# Patient Record
Sex: Female | Born: 1960 | Race: White | Hispanic: No | Marital: Married
Health system: Northeastern US, Academic
[De-identification: ages and names within clinical notes are randomized; demographics above are authoritative.]

---

## 2017-07-18 ENCOUNTER — Ambulatory Visit

## 2017-07-24 ENCOUNTER — Ambulatory Visit

## 2017-09-05 ENCOUNTER — Ambulatory Visit

## 2017-09-06 ENCOUNTER — Ambulatory Visit: Admitting: "Endocrinology

## 2017-09-06 NOTE — Progress Notes (Signed)
* * *        **Ann Blankenship**    --- ---    83 Y old Female, DOB: Jun 01, 1960, External MRN: [id number]    Account Number: [id number]    [address], [place], [id number]    Home: [phone]    Insurance: [employer] PPO Payer ID: PAPER    PCP: Pamelia Hoit, DO Referring: Pamelia Hoit, DO External Visit  ID: [id number]    Appointment Facility: [facility]        * * *    09/06/2017  Progress Notes: Vanetta Shawl **CHN#:** [id number]    --- ---    ---         **Current Medications**    ---    Taking     * Anastrozole 1 MG Tablet 1 tablet Orally Once a day    ---    * Calcium Carbonate-Vitamin D 600-200 MG-UNIT Capsule 1 capsule Orally Twice a day    ---    * Colace 100 MG Capsule 1 capsule as needed Orally Once a day    ---    * Prolia 60 MG/ML Solution as directed Subcutaneous 1 injection every 6 mons    ---    Not-Taking/PRN    * OXYCODONE 5 MG TABLET 1 tab every 4 hours PRN    ---    * PredniSONE 20 MG Tablet 1 tablet Orally Once a day    ---    * Probiotic 250 MG Capsule 1 capsule Orally Twice a day    ---    * Tramadol HCl 50 mg Tablet 1/2 tablet every 4 hours PRN    ---    * Zofran 4 MG Tablet 1 tablet Orally Once a day, Notes: PRN    ---    * Medication List reviewed and reconciled with the patient    ---      Past Medical History    ---       Systemic lupus erythematosus.        ---    Malignant neoplasm of breast, estrogen receptor positive.        ---    Basal cell carcinoma of skin.        ---    Osteopenia.        ---    Congenital hearing disorder/loss.        ---    Subacute bacterial endocarditis.        ---       **Surgical History**    ---       masectomy, breast biopsies prior 02/2017    ---    c-section 1998    ---    MOHS surg on face 2010    ---       **Family History**    ---       Mother: alive    ---    Father: alive    ---    age related med conditions with parents\n++ autoimmune disorders within fam hx    mother- thyroid issue, low BP.    ---        **Social History**    ---    Tobacco history: Formerly smoked.    Marital Status  Married.    Alcohol  Yes regularly socially weekly.   part time gardener.    ---       **Allergies**    ---       Chlorhexidine: rash    ---    [  Allergies Verified]       **Hospitalization/Major Diagnostic Procedure**    ---       Denies Past Hospitalization    ---       **Review of Systems**    ---     _Endo: Bone_ :    Constitutional: no fever, no abnormal weight change, no chills. Eyes: no  diplopia, clear vision. Head: no sore throat, no ear ache, no congestion, no  hair loss. Cardiovascular: no chest pain, no palpitations, no lightheadedness.  Pulmonary: no cough, no shortness of breath. GI: no abdominal pain, no  diarrhea, no constipation, no vomiting, no nausea, no epigastric pain, no  change in bowel habits. GU: no polyuria, no nocturia, no hematuria, no  dysuria, no hx of kidney stones. Skin: no rashes, no lesions, no edema, no  abnormal striae, no easy bruising, wound healing normal. Neurology: no  headaches, no syncope, no tremors. Psychology: no mood changes.  Musculoskeletal: no weakness, no stumbling gait, no joint pain, no back pain.            **Reason for Appointment**    ---       1\. OSTEOPENIA    ---       **History of Present Illness**    ---     _GENERAL_ :    Initial visit: osteopenia    Diagnosed: was diagnosed a couple years ago, unsure exactly when    Medications: has received 2 injections of Prolia (04/2017, 03/2016) via  oncology    had been on Foasmax for 3 years (2014-2017) and it was stopped by  endocrinologist (Dr. Joaquim Lai) because she felt that she was not getting any  benefit    last saw outside endo 03/2016    Prior fractures: no fracture    Parent history of hip fracture or family history of calcium/bone disorders:    Smoking/etoh: none currently/social    Glucocorticoid use: had been on daily steroids remotely in her 30s for SLE,  nothing in >18 years    Rheumatoid arthiritis: no    Breast cancer:  diagnosed 12/2016 and underwent mastectomy and on Anastrazole  since 03/2017 with plan to be on this for 5 years    Thyroid disease: no    GERD/Proton pump inhibitor use: no    Lactose intolerance/GI issues:    Dietary calcium/vit D intake: loves dairy, has cheese/yogurt/milk daily    Supplementation: takes 1 ca/D 2xd    Age at menopause: age 24    Prior hormone replacement therapy: no    Weight: stable    Height loss: think she has shrunk a bit    Exercise: goes to the gym twice a week and works as a Agricultural engineer    Per endo: prior compression at T6 and T8 based on thoracic xray in 2015.       **Vital Signs**    ---    Pain scale 0, Ht-in 59, Wt-lbs 102.4, BMI 20.68, BP 100/62, HR 68, Ht-cm  149.86, Wt-kg 46.45.       **Physical Examination**    ---    Gen: AAO x 3 NAD    Thyroid: normal, nontender, no nodules appreciated    CV: RRR no murmurs    Ext: no tremor of outstretched hands    Neuro: 2+ patellar deep tendon reflexes bilaterally    Back: No kyphosis or scars seen.       **Assessments**    ---    1\. Osteopenia after menopause - M81.0 (Primary)    ---  2\. Wedge compression fracture of T7-T8 vertebra, initial encounter for closed  fracture - S22.060A    ---    3\. Aromatase inhibitor use - Z79.811    ---      57 year old female here for initial visit for osteopenia, diagnosed in  approximately 2015. No fracture history. History of breast cancer now on  anastrozole since December 2018. A compression fracture was seen at T7 and T8  on a plain film from 2015. She has no other current risk factors for low bone  density. BMI 21.    She was previously on alendronate from 2014-2017 per outside endocrinologist.    Prolia shot number number one done in December 2017 and shot #2 done in  February 2019 at her oncologist's office.    ---    Outside hospital 2015 DEXA scan T-scores    Lumbar spine -0.5    Left fem neck -2.3    Total hip -1.7    Right fem neck -2.3    Right total hip -1.5    ---    Outside hospital 2017 DEXA  scan T-scores    Lumbar spine 1.2    Left femoral neck -1.3    Left hip -0.7    ---    Assessment/plan: We will update her DEXA scan as she has not had an updated  scan done in 2 years. We discussed that the decision to continue treatment  with Prolia will be based on her DEXA result. She will be on anastrozole  through 2023 so this will be an ongoing risk factor for low bone density. She  believes she has had recent blood work done through her oncologist office and  we will obtain these and add on labs if needed to evaluate for secondary  causes of bone loss. She is already on adequate calcium/D supplementation and  eats a fair amount of dairy. Follow-up will be in approximately 1 month once  updated DEXA and laboratory studies are completed.    ---    30-minute visit, greater than 15 minutes spent counseling patient on  osteopenia.    ---       **Treatment**    ---       **1\. Osteopenia after menopause**    _IMAGING: DEXA Hip and Spine_     57 yo with osteopenia, no fracture history,  on Anastrozole for breast cancer, needs 2 year f/u DEXA. 2017 outside DEXA  scan showed L spine T score 1.2, Left fem neck -1.3, Left hip -0.7    --- ---       **Follow Up**    ---    scheduled DEXA [place] after 6/8, get recent labs from PCP/hematology (done in  march), f/u in early june after DEXA here or later july after labs and dexa  done to disuss plan    Electronically signed by Vanetta Shawl MD on 09/06/2017 at 02:12 PM EDT    Sign off status: Completed        * * *        [facility]    [address]    [place], [place] [address]    Tel: [phone]    Fax: [phone]              * * *          Patient: Ann Blankenship, Ann Blankenship DOB: 1960/12/27 Progress Note: Vanetta Shawl  09/06/2017    ---    Note generated by eClinicalWorks  EMR/PM Software ([email or web address])

## 2017-09-06 NOTE — Progress Notes (Signed)
.  Progress Notes  .  Patient: Ann Blankenship  Provider: Vanetta Shawl  MD  .  DOB: July 09, 1960 Age: 57 Y Sex: Female  .  PCP: Pamelia Hoit DO  Date: 09/06/2017  .  --------------------------------------------------------------------------------  .  REASON FOR APPOINTMENT  .  1. OSTEOPENIA  .  HISTORY OF PRESENT ILLNESS  .  GENERAL:  Initial visit: osteopeniaDiagnosed: was  diagnosed a couple years ago, unsure exactly whenMedications: has  received 2 injections of Prolia (04/2017, 03/2016) via oncologyhad  been on Foasmax for 3 years (2014-2017) and it was stopped by  endocrinologist (Dr. Joaquim Lai) because she felt that she was not  getting any benefitlast saw outside endo 12/2017Prior fractures:  no fractureParent history of hip fracture or family history of  calcium/bone disorders:Smoking/etoh: none currently/social  Glucocorticoid use: had been on daily steroids remotely in her  30s for SLE, nothing in >18 yearsRheumatoid arthiritis: noBreast  cancer: diagnosed 12/2016 and underwent mastectomy and on  Anastrazole since 03/2017 with plan to be on this for 5  yearsThyroid disease: noGERD/Proton pump inhibitor use: noLactose  intolerance/GI issues:Dietary calcium/vit D intake: loves dairy,  has cheese/yogurt/milk dailySupplementation: takes 1 ca/D 2xdAge  at menopause: age 50Prior hormone replacement therapy: noWeight:  stableHeight loss: think she has shrunk a bitExercise: goes to  the gym twice a week and works as a gardenerPer endo: prior  compression at T6 and T8 based on thoracic xray in 2015.  Marland Kitchen  CURRENT MEDICATIONS  .  Taking Anastrozole 1 MG Tablet 1 tablet Orally Once a day  Taking Calcium Carbonate-Vitamin D 600-200 MG-UNIT Capsule 1  capsule Orally Twice a day  Taking Colace 100 MG Capsule 1 capsule as needed Orally Once a  day  Taking Prolia 60 MG/ML Solution as directed Subcutaneous 1  injection every 6 mons  Not-Taking/PRN OXYCODONE 5 MG TABLET 1 tab every 4 hours PRN  Not-Taking/PRN PredniSONE  20 MG Tablet 1 tablet Orally Once a day  Not-Taking/PRN Probiotic 250 MG Capsule 1 capsule Orally Twice a  day  Not-Taking/PRN Tramadol HCl 50 mg Tablet 1/2 tablet every 4 hours  PRN  Not-Taking/PRN Zofran 4 MG Tablet 1 tablet Orally Once a day,  Notes: PRN  Medication List reviewed and reconciled with the patient  .  PAST MEDICAL HISTORY  .  Systemic lupus erythematosus  Malignant neoplasm of breast, estrogen receptor positive  Basal cell carcinoma of skin  Osteopenia  Congenital hearing disorder/loss  Subacute bacterial endocarditis  .  ALLERGIES  .  Chlorhexidine: rash  .  SURGICAL HISTORY  .  masectomy, breast biopsies prior 02/2017  c-section 1998  MOHS surg on face 2010  .  FAMILY HISTORY  .  Mother: alive  Father: alive  age related med conditions with parents\n++ autoimmune disorders  within fam hxmother- thyroid issue, low BP.  Marland Kitchen  SOCIAL HISTORY  .  .  Tobaccohistory:Formerly smoked.  .  Marital Status Married.  .  Alcohol Yes regularly socially weekly.  .  part time gardener.  Marland Kitchen  HOSPITALIZATION/MAJOR DIAGNOSTIC PROCEDURE  .  Denies Past Hospitalization  .  REVIEW OF SYSTEMS  .  Endo: Bone:  .  Constitutional:    no fever, no abnormal weight change, no chills  . Eyes:    no diplopia, clear vision . Head:    no sore throat,  no ear ache, no congestion, no hair loss . Cardiovascular:    no  chest pain, no palpitations, no lightheadedness . Pulmonary:  no cough, no shortness of breath . GI:    no abdominal pain, no  diarrhea, no constipation, no vomiting, no nausea, no epigastric  pain, no change in bowel habits . GU:    no polyuria, no  nocturia, no hematuria, no dysuria, no hx of kidney stones .  Skin:    no rashes, no lesions, no edema, no abnormal striae, no  easy bruising, wound healing normal . Neurology:    no headaches,  no syncope, no tremors . Psychology:    no mood changes .  Musculoskeletal:    no weakness, no stumbling gait, no joint  pain, no back pain .  Marland Kitchen  VITAL SIGNS  .  Pain scale 0,  Ht-in 59, Wt-lbs 102.4, BMI 20.68, BP 100/62, HR  68, Ht-cm 149.86, Wt-kg 46.45.  Marland Kitchen  PHYSICAL EXAMINATION  .  Gen: AAO x 3 NADThyroid: normal, nontender, no nodules  appreciatedCV: RRR no murmursExt: no tremor of outstretched  handsNeuro: 2+ patellar deep tendon reflexes bilaterallyBack: No  kyphosis or scars seen.  .  ASSESSMENTS  .  Osteopenia after menopause - M81.0 (Primary)  .  Wedge compression fracture of T7-T8 vertebra, initial encounter  for closed fracture - S22.060A  .  Aromatase inhibitor use - Z29.14  .  57 year old female here for initial visit for osteopenia,  diagnosed in approximately 2015. No fracture history. History of  breast cancer now on anastrozole since December 2018. A  compression fracture was seen at T7 and T8 on a plain film from  2015. She has no other current risk factors for low bone density.  BMI 21.She was previously on alendronate from 2014-2017 per  outside endocrinologist.Prolia shot number number one done in  December 2017 and shot #2 done in February 2019 at her  oncologist's office.---Outside hospital 2015 DEXA scan  T-scoresLumbar spine -0.5Left fem neck -2.3Total hip -1.7Right  fem neck -2.3Right total hip -1.5---Outside hospital 2017 DEXA  scan T-scoresLumbar spine 1.2Left femoral neck -1.3Left hip  -0.7---Assessment/plan: We will update her DEXA scan as she has  not had an updated scan done in 2 years. We discussed that the  decision to continue treatment with Prolia will be based on her  DEXA result. She will be on anastrozole through 2023 so this will  be an ongoing risk factor for low bone density. She believes she  has had recent blood work done through her oncologist office and  we will obtain these and add on labs if needed to evaluate for  secondary causes of bone loss. She is already on adequate  calcium/D supplementation and eats a fair amount of dairy.  Follow-up will be in approximately 1 month once updated DEXA and  laboratory studies are  completed.---30-minute visit, greater than  15 minutes spent counseling patient on osteopenia.  Marland Kitchen  TREATMENT  .  Osteopenia after menopause  DEXA Hip and [id number] yo with osteopenia, no fracture  history, on Anastrozole for breast cancer, needs 2 year f/u DEXA.  2017 outside DEXA scan showed L spine T score 1.2, Left fem neck  -1.3, Left hip -0.7  .  FOLLOW UP  .  scheduled DEXA [place] after 6/8, get recent labs from  PCP/hematology (done in march), f/u in early june after DEXA here  or later july after labs and dexa done to disuss plan  .  Electronically signed by Vanetta Shawl MD on  09/06/2017 at 02:12 PM EDT  .  Document electronically signed by Vanetta Shawl  MD  .

## 2017-10-11 ENCOUNTER — Ambulatory Visit: Admitting: "Endocrinology

## 2017-10-11 ENCOUNTER — Ambulatory Visit (HOSPITAL_BASED_OUTPATIENT_CLINIC_OR_DEPARTMENT_OTHER): Admitting: Psychiatry

## 2017-10-11 ENCOUNTER — Ambulatory Visit

## 2017-10-11 NOTE — Progress Notes (Signed)
.  Progress Notes  .  Patient: Ann Blankenship  Provider: Vanetta Shawl  MD  .  DOB: 06/04/1960 Age: 57 Y Sex: Female  .  PCP: Pamelia Hoit DO  Date: 10/11/2017  .  --------------------------------------------------------------------------------  .  REASON FOR APPOINTMENT  .  1. FU 06/12 DEXA  .  HISTORY OF PRESENT ILLNESS  .  GENERAL:  Follow up visit: here to discuss dexa  results/plan.  .  CURRENT MEDICATIONS  .  Taking Anastrozole 1 MG Tablet 1 tablet Orally Once a day  Taking Calcium Carbonate-Vitamin D 600-200 MG-UNIT Capsule 1  capsule Orally Twice a day  Not-Taking/PRN Colace 100 MG Capsule 1 capsule as needed Orally  Once a day  Not-Taking/PRN OXYCODONE 5 MG TABLET 1 tab every 4 hours PRN  Not-Taking/PRN PredniSONE 20 MG Tablet 1 tablet Orally Once a day  Not-Taking/PRN Probiotic 250 MG Capsule 1 capsule Orally Twice a  day  Not-Taking/PRN Tramadol HCl 50 mg Tablet 1/2 tablet every 4 hours  PRN  Not-Taking/PRN Zofran 4 MG Tablet 1 tablet Orally Once a day,  Notes: PRN  Discontinued Prolia 60 MG/ML Solution as directed Subcutaneous 1  injection every 6 mons  Medication List reviewed and reconciled with the patient  .  PAST MEDICAL HISTORY  .  Systemic lupus erythematosus  Malignant neoplasm of breast, estrogen receptor positive  Basal cell carcinoma of skin  Osteopenia  Congenital hearing disorder/loss  Subacute bacterial endocarditis  .  ALLERGIES  .  Chlorhexidine: rash  .  SURGICAL HISTORY  .  masectomy, breast biopsies prior 02/2017  c-section 1998  MOHS surg on face 2010  .  FAMILY HISTORY  .  Mother: alive  Father: alive  age related med conditions with parents\\n++ autoimmune disorders  within fam hx\nmother- thyroid issue, low BP.  Marland Kitchen  SOCIAL HISTORY  .  .  Tobaccohistory:Formerly smoked.  .  Marital Status Married.  .  Alcohol Yes regularly socially weekly.  .  part time gardener.  Marland Kitchen  HOSPITALIZATION/MAJOR DIAGNOSTIC PROCEDURE  .  Denies Past Hospitalization  .  REVIEW OF  SYSTEMS  .  Constitutional: no fever, no abnormal weight change, no  chillsEyes: no diplopia, clear visionHead: no sore throat, no ear  ache, no congestion, no hair loss. Cardiovascular: no chest pain,  no palpitations, no lightheadednessPulmonary: no cough, no  shortness of breathGI: no abdominal pain, no diarrhea, no  constipation, no vomiting, no nausea, no epigastric pain, no  change in bowel habitsGU: no polyuria, polydypsia, no nocturia,  no hematuria, no dysuria, no hx of kidney stones. Skin: no  rashes, no lesions, no edema, no abnormal striae, no easy  bruising, wound healing normalNeurology: no headaches, no  syncope, no tremors, no numbness or tinglingPsychology: no mood  changesMusculoskeletal: no weakness, no stumbling gait, no joint  pain, no back pain.  Marland Kitchen  VITAL SIGNS  .  Pain scale 0, Ht-in 59, Wt-lbs 103, BMI 20.80, BP 102/60, HR 62,  Ht-cm 149.86, Wt-kg 46.72.  Marland Kitchen  PHYSICAL EXAMINATION  .  Gen: AAO x 3 NAD.  Marland Kitchen  ASSESSMENTS  .  Osteopenia after menopause - M81.0 (Primary)  .  Wedge compression fracture of T7-T8 vertebra, initial encounter  for closed fracture - S22.060A  .  Aromatase inhibitor use - Z63.80  .  57 year old female here for initial visit for osteopenia,  diagnosed in approximately 2015. No fracture history. History of  breast cancer now on anastrozole since December 2018. A  compression fracture  was seen at T7 and T8 on a plain film from  2015. She has no other current risk factors for low bone density.  BMI 21.She was previously on alendronate from 2014-2017 per  outside endocrinologist.Prolia shot number number one done in  December 2017 and shot #2 done in February 2019 at her  oncologist's office.---Outside hospital 2015 DEXA scan  T-scoresLumbar spine -0.5Left fem neck -2.3Total hip -1.7Right  fem neck -2.3Right total hip -1.5---Outside hospital 2017 DEXA  scan T-scoresLumbar spine 1.2Left femoral neck -1.3Left hip  -0.7---Outside hospital 2019 DEXA scan T-scoresLumbar spine  -0.2:  4.6% increase compared with 2015Left proximal femur -2.2Left  total hip -1.6Right proximal femur -2Right total hip -1.2Total  mean hips -1.4: 3.7% increase compared with 2015---04/2017 outside  labs:PTH 50 calcium 9.6 albumin 4.8Comprehensive metabolic panel  and complete blood count with differential  normal---Assessment/plan: 2019 DEXA report reviewed and shows  osteopenia. FRAX calculator indicates that 10-year probability of  major osteoporotic fracture is 15% and hip fracture risk is 2.5%.  (Taking into account history of compression fracture). We  discussed that the [facility] calculator does not take into account the  fact that she is on ongoing treatment with anastrozole. Given  this, we will repeat her DEXA at a one-year interval. She is  already on adequate calcium/D supplementation and eats a fair  amount of dairy. Encouraged her to be as active as possible with  weightbearing exercise. We will complete work-up for secondary  causes of osteoporosis with laboratory evaluation as below.---15  min visit, 10 min spent counseling patient on osteopenia.  Marland Kitchen  TREATMENT  .  Osteopenia after menopause  LAB: IRON, TIBC AND FERRITIN PANEL  .  LAB: PTH, INTACT AND CALCIUM  .  LAB: TSH W/REFLEX TO FT4  .  LAB: TISSUE TRANSGLUTAMINASE ANTIBODY, IGA (REFL)  .  LAB: Vitamin D, 25 Hydroxy, Total , Immunoassay  .  DEXA Hip, Spine, and Radius (Ordered for 10/11/2018)[id number] yo  F with history of T7-T8 compression fracture with osteopenia, on  Anastrazole for breast cancer, needs 1 year f/u  .  Marland Kitchen  Others  Notes:  .  Please allow at least 24 hours for any calls to be returned. For  urgent calls on nights and weekends, please call at [phone]  and ask the on call endocrinology doctor to be paged.  .  If you arrive late to your appointment, we cannot guarantee you  will be seen and you may be asked to reschedule your appointment  for a later date.  .  A follow up appointment must be in place for refill requests to  be  honored. If you do not have a follow up appointment in place,  please call our office to schedule one, otherwise your refill  requests cannot be honored.  .  Please give our office at least 5 business days notice for any  refill requests, and up to 10 business days notice for any mail  order pharmacy requests. We cannot guarantee your refill will be  sent on time otherwise. Refills are only sent on weekdays, not  weekends.  .  Please remember to do your scheduled lab tests at least 1-2 weeks  prior to your scheduled follow up appointment. If lab tests are  not resulted before the visit, you will be notified by letter  with the results.  .  For diabetic visits, please bring your blood sugar meter and/or  continuous glucose monitors with you to all visits.  .  .  .  FOLLOW UP  .  give pt lab slip to do all labs next week with outside MD, f/u in  1 year with DEXA prior  .  Electronically signed by Vanetta Shawl MD on  10/11/2017 at 01:45 PM EDT  .  Document electronically signed by Vanetta Shawl  MD  .

## 2017-10-11 NOTE — Progress Notes (Signed)
* * *        **Ann Blankenship**    --- ---    57 Y old Female, DOB: 1960-12-04, External MRN: [id number]    Account Number: [id number]    [address], [place], [place]    Home: [phone]    Insurance: [employer] PPO Payer ID: PAPER    PCP: Pamelia Hoit, DO Referring: Pamelia Hoit, DO External Visit  ID: [id number]    Appointment Facility: [facility]        * * *    10/11/2017  Progress Notes: Vanetta Shawl **CHN#:** [id number]    --- ---    ---         **Current Medications**    ---    Taking     * Anastrozole 1 MG Tablet 1 tablet Orally Once a day    ---    * Calcium Carbonate-Vitamin D 600-200 MG-UNIT Capsule 1 capsule Orally Twice a day    ---    Not-Taking/PRN    * Colace 100 MG Capsule 1 capsule as needed Orally Once a day    ---    * OXYCODONE 5 MG TABLET 1 tab every 4 hours PRN    ---    * PredniSONE 20 MG Tablet 1 tablet Orally Once a day    ---    * Probiotic 250 MG Capsule 1 capsule Orally Twice a day    ---    * Tramadol HCl 50 mg Tablet 1/2 tablet every 4 hours PRN    ---    * Zofran 4 MG Tablet 1 tablet Orally Once a day, Notes: PRN    ---    Discontinued    * Prolia 60 MG/ML Solution as directed Subcutaneous 1 injection every 6 mons    ---    * Medication List reviewed and reconciled with the patient    ---      Past Medical History    ---       Systemic lupus erythematosus.        ---    Malignant neoplasm of breast, estrogen receptor positive.        ---    Basal cell carcinoma of skin.        ---    Osteopenia.        ---    Congenital hearing disorder/loss.        ---    Subacute bacterial endocarditis.        ---       **Surgical History**    ---       masectomy, breast biopsies prior 02/2017    ---    c-section 1998    ---    [facility] surg on face 2010    ---       **Family History**    ---       Mother: alive    ---    Father: alive    ---    age related med conditions with parents\\n++ autoimmune disorders within fam  hx\nmother- thyroid issue, low BP.     ---       **Social History**    ---    Tobacco history: Formerly smoked.    Marital Status  Married.    Alcohol  Yes regularly socially weekly.   part time gardener.    ---       **Allergies**    ---       Chlorhexidine:  rash    ---    Forrestine Him Verified]       **Hospitalization/Major Diagnostic Procedure**    ---       Denies Past Hospitalization    ---       **Review of Systems**    ---    Constitutional: no fever, no abnormal weight change, no chills    Eyes: no diplopia, clear vision    Head: no sore throat, no ear ache, no congestion, no hair loss.    Cardiovascular: no chest pain, no palpitations, no lightheadedness    Pulmonary: no cough, no shortness of breath    GI: no abdominal pain, no diarrhea, no constipation, no vomiting, no nausea,  no epigastric pain, no change in bowel habits    GU: no polyuria, polydypsia, no nocturia, no hematuria, no dysuria, no hx of  kidney stones.    Skin: no rashes, no lesions, no edema, no abnormal striae, no easy bruising,  wound healing normal    Neurology: no headaches, no syncope, no tremors, no numbness or tingling    Psychology: no mood changes    Musculoskeletal: no weakness, no stumbling gait, no joint pain, no back pain.         **Reason for Appointment**    ---       1\. FU 06/12 DEXA    ---       **History of Present Illness**    ---     _GENERAL_ :    Follow up visit: here to discuss dexa results/plan.       **Vital Signs**    ---    Pain scale 0, Ht-in 59, Wt-lbs 103, BMI 20.80, BP 102/60, HR 62, Ht-cm 149.86,  Wt-kg 46.72.       **Physical Examination**    ---    Gen: AAO x 3 NAD.       **Assessments**    ---    1\. Osteopenia after menopause - M81.0 (Primary)    ---    2\. Wedge compression fracture of T7-T8 vertebra, initial encounter for closed  fracture - S22.060A    ---    3\. Aromatase inhibitor use - Z79.811    ---      57 year old female here for initial visit for osteopenia, diagnosed in  approximately 2015. No fracture history. History of breast  cancer now on  anastrozole since December 2018. A compression fracture was seen at T7 and T8  on a plain film from 2015. She has no other current risk factors for low bone  density. BMI 21.    She was previously on alendronate from 2014-2017 per outside endocrinologist.    Prolia shot number number one done in December 2017 and shot #2 done in  February 2019 at her oncologist's office.    ---    Outside hospital 2015 DEXA scan T-scores    Lumbar spine -0.5    Left fem neck -2.3    Total hip -1.7    Right fem neck -2.3    Right total hip -1.5    ---    Outside hospital 2017 DEXA scan T-scores    Lumbar spine 1.2    Left femoral neck -1.3    Left hip -0.7    ---    Outside hospital 2019 DEXA scan T-scores    Lumbar spine -0.2: 4.6% increase compared with 2015    Left proximal femur -2.2    Left total hip -1.6    Right proximal femur -2  Right total hip -1.2    Total mean hips -1.4: 3.7% increase compared with 2015    ---    04/2017 outside labs:    PTH 50 calcium 9.6 albumin 4.8    Comprehensive metabolic panel and complete blood count with differential  normal    ---    Assessment/plan: 2019 DEXA report reviewed and shows osteopenia. FRAX  calculator indicates that 10-year probability of major osteoporotic fracture  is 15% and hip fracture risk is 2.5%. (Taking into account history of  compression fracture). We discussed that the [facility] calculator does not take  into account the fact that she is on ongoing treatment with anastrozole. Given  this, we will repeat her DEXA at a one-year interval. She is already on  adequate calcium/D supplementation and eats a fair amount of dairy. Encouraged  her to be as active as possible with weightbearing exercise. We will complete  work-up for secondary causes of osteoporosis with laboratory evaluation as  below.    ---    15 min visit, 10 min spent counseling patient on osteopenia.    ---       **Treatment**    ---       **1\. Osteopenia after menopause**    _LAB: IRON, TIBC AND  FERRITIN PANEL_    _LAB: PTH, INTACT AND CALCIUM_    _LAB: TSH W/REFLEX TO FT4_    _LAB: TISSUE TRANSGLUTAMINASE ANTIBODY, IGA (REFL)_    _LAB: Vitamin D, 25 Hydroxy, Total , Immunoassay_    _IMAGING: DEXA Hip, Spine, and Radius (Ordered for 10/11/2018)_     57 yo F  with history of T7-T8 compression fracture with osteopenia, on Anastrazole for  breast cancer, needs 1 year f/u    --- ---         **2\. Others**    Notes:    Please allow at least 24 hours for any calls to be returned. For urgent calls  on nights and weekends, please call at [phone] and ask the on call  endocrinology doctor to be paged.    If you arrive late to your appointment, we cannot guarantee you will be seen  and you may be asked to reschedule your appointment for a later date.    A follow up appointment must be in place for refill requests to be honored. If  you do not have a follow up appointment in place, please call our office to  schedule one, otherwise your refill requests cannot be honored.    Please give our office at least 5 business days notice for any refill  requests, and up to 10 business days notice for any mail order pharmacy  requests. We cannot guarantee your refill will be sent on time otherwise.  Refills are only sent on weekdays, not weekends.    Please remember to do your scheduled lab tests at least 1-2 weeks prior to  your scheduled follow up appointment. If lab tests are not resulted before the  visit, you will be notified by letter with the results.    For diabetic visits, please bring your blood sugar meter and/or continuous  glucose monitors with you to all visits.    .      **Follow Up**    ---    give pt lab slip to do all labs next week with outside MD, f/u in 1 year with  DEXA prior    Electronically signed by Vanetta Shawl MD on 10/11/2017 at 01:45 PM EDT  Sign off status: Completed        * * *        [facility]    [address]    [place], [place] [address]    Tel: [phone]    Fax:  [phone]              * * *          Patient: DYLIN, IHNEN DOB: Dec 09, 1960 Progress Note: Vanetta Shawl  10/11/2017    ---    Note generated by eClinicalWorks EMR/PM Software ([email or web address])

## 2017-10-16 ENCOUNTER — Ambulatory Visit

## 2017-10-17 ENCOUNTER — Ambulatory Visit: Admitting: "Endocrinology

## 2017-10-17 NOTE — Progress Notes (Signed)
* * *        **  Ann Blankenship    --- ---    37 Y old Female, DOB: 26-Aug-1960    [address], [place], [place] [address]    Home: [phone]    Provider: Vanetta Shawl, MD        * * *    Telephone Encounter    ---    Answered by   Vanetta Shawl  Date: 10/17/2017         Time: 09:04 AM    Reason   Lab results    --- ---            Message                      09/2017 outside lab results reviewed, all labs including basic metabolic profile, vitamin D 25 OH, PTH, TSH, celiac disease screen negative.  Iron studies suggestive of iron deficiency anemia with low iron, low percent saturation, and low ferritin.                Action Taken                      [facility] , MD 10/17/2017 9:05:46 AM > letter: Your blood tests showed that you may have iron deficiency anemia.  I would discuss these laboratory results further with your PCP to see whether or not supplementation with oral iron is warranted.  The rest of your blood tests were normal.      Johnson,Chelsea , LPN 2/72/5366 4:40:34 AM > mailed                    * * *                ---          * * *          Patient: Ann Blankenship, Ann Blankenship DOB: November 01, 1960 Provider: Vanetta Shawl, MD  10/17/2017    ---    Note generated by eClinicalWorks EMR/PM Software ([email or web address])

## 2018-10-11 ENCOUNTER — Ambulatory Visit: Admitting: "Endocrinology

## 2019-01-01 ENCOUNTER — Ambulatory Visit

## 2020-07-26 NOTE — Progress Notes (Signed)
* * *        **Ann Blankenship**    --- ---    60 Y old Female, DOB: 1960-12-04, External MRN: [id number]    Account Number: [id number]    [address], [place], [place]    Home: [phone]    Insurance: [employer] PPO Payer ID: PAPER    PCP: Pamelia Hoit, DO Referring: Pamelia Hoit, DO External Visit  ID: [id number]    Appointment Facility: [facility]        * * *    10/11/2017  Progress Notes: Vanetta Shawl **CHN#:** [id number]    --- ---    ---         **Current Medications**    ---    Taking     * Anastrozole 1 MG Tablet 1 tablet Orally Once a day    ---    * Calcium Carbonate-Vitamin D 600-200 MG-UNIT Capsule 1 capsule Orally Twice a day    ---    Not-Taking/PRN    * Colace 100 MG Capsule 1 capsule as needed Orally Once a day    ---    * OXYCODONE 5 MG TABLET 1 tab every 4 hours PRN    ---    * PredniSONE 20 MG Tablet 1 tablet Orally Once a day    ---    * Probiotic 250 MG Capsule 1 capsule Orally Twice a day    ---    * Tramadol HCl 50 mg Tablet 1/2 tablet every 4 hours PRN    ---    * Zofran 4 MG Tablet 1 tablet Orally Once a day, Notes: PRN    ---    Discontinued    * Prolia 60 MG/ML Solution as directed Subcutaneous 1 injection every 6 mons    ---    * Medication List reviewed and reconciled with the patient    ---      Past Medical History    ---       Systemic lupus erythematosus.        ---    Malignant neoplasm of breast, estrogen receptor positive.        ---    Basal cell carcinoma of skin.        ---    Osteopenia.        ---    Congenital hearing disorder/loss.        ---    Subacute bacterial endocarditis.        ---       **Surgical History**    ---       masectomy, breast biopsies prior 02/2017    ---    c-section 1998    ---    [facility] surg on face 2010    ---       **Family History**    ---       Mother: alive    ---    Father: alive    ---    age related med conditions with parents\\n++ autoimmune disorders within fam  hx\nmother- thyroid issue, low BP.     ---       **Social History**    ---    Tobacco history: Formerly smoked.    Marital Status  Married.    Alcohol  Yes regularly socially weekly.   part time gardener.    ---       **Allergies**    ---       Chlorhexidine:  rash    ---    Forrestine Him Verified]       **Hospitalization/Major Diagnostic Procedure**    ---       Denies Past Hospitalization    ---       **Review of Systems**    ---    Constitutional: no fever, no abnormal weight change, no chills    Eyes: no diplopia, clear vision    Head: no sore throat, no ear ache, no congestion, no hair loss.    Cardiovascular: no chest pain, no palpitations, no lightheadedness    Pulmonary: no cough, no shortness of breath    GI: no abdominal pain, no diarrhea, no constipation, no vomiting, no nausea,  no epigastric pain, no change in bowel habits    GU: no polyuria, polydypsia, no nocturia, no hematuria, no dysuria, no hx of  kidney stones.    Skin: no rashes, no lesions, no edema, no abnormal striae, no easy bruising,  wound healing normal    Neurology: no headaches, no syncope, no tremors, no numbness or tingling    Psychology: no mood changes    Musculoskeletal: no weakness, no stumbling gait, no joint pain, no back pain.         **Reason for Appointment**    ---       1\. FU 06/12 DEXA    ---       **History of Present Illness**    ---     _GENERAL_ :    Follow up visit: here to discuss dexa results/plan.       **Vital Signs**    ---    Pain scale 0, Ht-in 59, Wt-lbs 103, BMI 20.80, BP 102/60, HR 62, Ht-cm 149.86,  Wt-kg 46.72.       **Physical Examination**    ---    Gen: AAO x 3 NAD.       **Assessments**    ---    1\. Osteopenia after menopause - M81.0 (Primary)    ---    2\. Wedge compression fracture of T7-T8 vertebra, initial encounter for closed  fracture - S22.060A    ---    3\. Aromatase inhibitor use - Z79.811    ---      60 year old female here for initial visit for osteopenia, diagnosed in  approximately 2015. No fracture history. History of breast  cancer now on  anastrozole since December 2018. A compression fracture was seen at T7 and T8  on a plain film from 2015. She has no other current risk factors for low bone  density. BMI 21.    She was previously on alendronate from 2014-2017 per outside endocrinologist.    Prolia shot number number one done in December 2017 and shot #2 done in  February 2019 at her oncologist's office.    ---    Outside hospital 2015 DEXA scan T-scores    Lumbar spine -0.5    Left fem neck -2.3    Total hip -1.7    Right fem neck -2.3    Right total hip -1.5    ---    Outside hospital 2017 DEXA scan T-scores    Lumbar spine 1.2    Left femoral neck -1.3    Left hip -0.7    ---    Outside hospital 2019 DEXA scan T-scores    Lumbar spine -0.2: 4.6% increase compared with 2015    Left proximal femur -2.2    Left total hip -1.6    Right proximal femur -2  Right total hip -1.2    Total mean hips -1.4: 3.7% increase compared with 2015    ---    04/2017 outside labs:    PTH 50 calcium 9.6 albumin 4.8    Comprehensive metabolic panel and complete blood count with differential  normal    ---    Assessment/plan: 2019 DEXA report reviewed and shows osteopenia. FRAX  calculator indicates that 10-year probability of major osteoporotic fracture  is 15% and hip fracture risk is 2.5%. (Taking into account history of  compression fracture). We discussed that the [facility] calculator does not take  into account the fact that she is on ongoing treatment with anastrozole. Given  this, we will repeat her DEXA at a one-year interval. She is already on  adequate calcium/D supplementation and eats a fair amount of dairy. Encouraged  her to be as active as possible with weightbearing exercise. We will complete  work-up for secondary causes of osteoporosis with laboratory evaluation as  below.    ---    15 min visit, 10 min spent counseling patient on osteopenia.    ---       **Treatment**    ---       **1\. Osteopenia after menopause**    _LAB: IRON, TIBC AND  FERRITIN PANEL_    _LAB: PTH, INTACT AND CALCIUM_    _LAB: TSH W/REFLEX TO FT4_    _LAB: TISSUE TRANSGLUTAMINASE ANTIBODY, IGA (REFL)_    _LAB: Vitamin D, 25 Hydroxy, Total , Immunoassay_    _IMAGING: DEXA Hip, Spine, and Radius (Ordered for 10/11/2018)_     60 yo F  with history of T7-T8 compression fracture with osteopenia, on Anastrazole for  breast cancer, needs 1 year f/u    --- ---         **2\. Others**    Notes:    Please allow at least 24 hours for any calls to be returned. For urgent calls  on nights and weekends, please call at [phone] and ask the on call  endocrinology doctor to be paged.    If you arrive late to your appointment, we cannot guarantee you will be seen  and you may be asked to reschedule your appointment for a later date.    A follow up appointment must be in place for refill requests to be honored. If  you do not have a follow up appointment in place, please call our office to  schedule one, otherwise your refill requests cannot be honored.    Please give our office at least 5 business days notice for any refill  requests, and up to 10 business days notice for any mail order pharmacy  requests. We cannot guarantee your refill will be sent on time otherwise.  Refills are only sent on weekdays, not weekends.    Please remember to do your scheduled lab tests at least 1-2 weeks prior to  your scheduled follow up appointment. If lab tests are not resulted before the  visit, you will be notified by letter with the results.    For diabetic visits, please bring your blood sugar meter and/or continuous  glucose monitors with you to all visits.    .      **Follow Up**    ---    give pt lab slip to do all labs next week with outside MD, f/u in 1 year with  DEXA prior    Electronically signed by Vanetta Shawl MD on 10/11/2017 at 01:45 PM EDT  Sign off status: Completed        * * *        [facility]    [address]    [place], [place] [address]    Tel: [phone]    Fax:  [phone]              * * *          Patient: Ann Blankenship, Ann Blankenship DOB: Dec 09, 1960 Progress Note: Vanetta Shawl  10/11/2017    ---    Note generated by eClinicalWorks EMR/PM Software ([email or web address])

## 2020-07-26 NOTE — Progress Notes (Signed)
* * *        **Ann Blankenship**    --- ---    83 Y old Female, DOB: Jun 01, 1960, External MRN: [id number]    Account Number: [id number]    [address], [place], [place]    Home: [phone]    Insurance: [employer] PPO Payer ID: PAPER    PCP: Pamelia Hoit, DO Referring: Pamelia Hoit, DO External Visit  ID: [id number]    Appointment Facility: [facility]        * * *    09/06/2017  Progress Notes: Vanetta Shawl **CHN#:** [id number]    --- ---    ---         **Current Medications**    ---    Taking     * Anastrozole 1 MG Tablet 1 tablet Orally Once a day    ---    * Calcium Carbonate-Vitamin D 600-200 MG-UNIT Capsule 1 capsule Orally Twice a day    ---    * Colace 100 MG Capsule 1 capsule as needed Orally Once a day    ---    * Prolia 60 MG/ML Solution as directed Subcutaneous 1 injection every 6 mons    ---    Not-Taking/PRN    * OXYCODONE 5 MG TABLET 1 tab every 4 hours PRN    ---    * PredniSONE 20 MG Tablet 1 tablet Orally Once a day    ---    * Probiotic 250 MG Capsule 1 capsule Orally Twice a day    ---    * Tramadol HCl 50 mg Tablet 1/2 tablet every 4 hours PRN    ---    * Zofran 4 MG Tablet 1 tablet Orally Once a day, Notes: PRN    ---    * Medication List reviewed and reconciled with the patient    ---      Past Medical History    ---       Systemic lupus erythematosus.        ---    Malignant neoplasm of breast, estrogen receptor positive.        ---    Basal cell carcinoma of skin.        ---    Osteopenia.        ---    Congenital hearing disorder/loss.        ---    Subacute bacterial endocarditis.        ---       **Surgical History**    ---       masectomy, breast biopsies prior 02/2017    ---    c-section 1998    ---    MOHS surg on face 2010    ---       **Family History**    ---       Mother: alive    ---    Father: alive    ---    age related med conditions with parents\n++ autoimmune disorders within fam hx    mother- thyroid issue, low BP.    ---        **Social History**    ---    Tobacco history: Formerly smoked.    Marital Status  Married.    Alcohol  Yes regularly socially weekly.   part time gardener.    ---       **Allergies**    ---       Chlorhexidine: rash    ---    [  Allergies Verified]       **Hospitalization/Major Diagnostic Procedure**    ---       Denies Past Hospitalization    ---       **Review of Systems**    ---     _Endo: Bone_ :    Constitutional: no fever, no abnormal weight change, no chills. Eyes: no  diplopia, clear vision. Head: no sore throat, no ear ache, no congestion, no  hair loss. Cardiovascular: no chest pain, no palpitations, no lightheadedness.  Pulmonary: no cough, no shortness of breath. GI: no abdominal pain, no  diarrhea, no constipation, no vomiting, no nausea, no epigastric pain, no  change in bowel habits. GU: no polyuria, no nocturia, no hematuria, no  dysuria, no hx of kidney stones. Skin: no rashes, no lesions, no edema, no  abnormal striae, no easy bruising, wound healing normal. Neurology: no  headaches, no syncope, no tremors. Psychology: no mood changes.  Musculoskeletal: no weakness, no stumbling gait, no joint pain, no back pain.            **Reason for Appointment**    ---       1\. OSTEOPENIA    ---       **History of Present Illness**    ---     _GENERAL_ :    Initial visit: osteopenia    Diagnosed: was diagnosed a couple years ago, unsure exactly when    Medications: has received 2 injections of Prolia (04/2017, 03/2016) via  oncology    had been on Foasmax for 3 years (2014-2017) and it was stopped by  endocrinologist (Dr. Joaquim Lai) because she felt that she was not getting any  benefit    last saw outside endo 03/2016    Prior fractures: no fracture    Parent history of hip fracture or family history of calcium/bone disorders:    Smoking/etoh: none currently/social    Glucocorticoid use: had been on daily steroids remotely in her 30s for SLE,  nothing in >18 years    Rheumatoid arthiritis: no    Breast cancer:  diagnosed 12/2016 and underwent mastectomy and on Anastrazole  since 03/2017 with plan to be on this for 5 years    Thyroid disease: no    GERD/Proton pump inhibitor use: no    Lactose intolerance/GI issues:    Dietary calcium/vit D intake: loves dairy, has cheese/yogurt/milk daily    Supplementation: takes 1 ca/D 2xd    Age at menopause: age 24    Prior hormone replacement therapy: no    Weight: stable    Height loss: think she has shrunk a bit    Exercise: goes to the gym twice a week and works as a Agricultural engineer    Per endo: prior compression at T6 and T8 based on thoracic xray in 2015.       **Vital Signs**    ---    Pain scale 0, Ht-in 59, Wt-lbs 102.4, BMI 20.68, BP 100/62, HR 68, Ht-cm  149.86, Wt-kg 46.45.       **Physical Examination**    ---    Gen: AAO x 3 NAD    Thyroid: normal, nontender, no nodules appreciated    CV: RRR no murmurs    Ext: no tremor of outstretched hands    Neuro: 2+ patellar deep tendon reflexes bilaterally    Back: No kyphosis or scars seen.       **Assessments**    ---    1\. Osteopenia after menopause - M81.0 (Primary)    ---  2\. Wedge compression fracture of T7-T8 vertebra, initial encounter for closed  fracture - S22.060A    ---    3\. Aromatase inhibitor use - Z79.811    ---      60 year old female here for initial visit for osteopenia, diagnosed in  approximately 2015. No fracture history. History of breast cancer now on  anastrozole since December 2018. A compression fracture was seen at T7 and T8  on a plain film from 2015. She has no other current risk factors for low bone  density. BMI 21.    She was previously on alendronate from 2014-2017 per outside endocrinologist.    Prolia shot number number one done in December 2017 and shot #2 done in  February 2019 at her oncologist's office.    ---    Outside hospital 2015 DEXA scan T-scores    Lumbar spine -0.5    Left fem neck -2.3    Total hip -1.7    Right fem neck -2.3    Right total hip -1.5    ---    Outside hospital 2017 DEXA  scan T-scores    Lumbar spine 1.2    Left femoral neck -1.3    Left hip -0.7    ---    Assessment/plan: We will update her DEXA scan as she has not had an updated  scan done in 2 years. We discussed that the decision to continue treatment  with Prolia will be based on her DEXA result. She will be on anastrozole  through 2023 so this will be an ongoing risk factor for low bone density. She  believes she has had recent blood work done through her oncologist office and  we will obtain these and add on labs if needed to evaluate for secondary  causes of bone loss. She is already on adequate calcium/D supplementation and  eats a fair amount of dairy. Follow-up will be in approximately 1 month once  updated DEXA and laboratory studies are completed.    ---    30-minute visit, greater than 15 minutes spent counseling patient on  osteopenia.    ---       **Treatment**    ---       **1\. Osteopenia after menopause**    _IMAGING: DEXA Hip and Spine_     60 yo with osteopenia, no fracture history,  on Anastrozole for breast cancer, needs 2 year f/u DEXA. 2017 outside DEXA  scan showed L spine T score 1.2, Left fem neck -1.3, Left hip -0.7    --- ---       **Follow Up**    ---    scheduled DEXA [place] after 6/8, get recent labs from PCP/hematology (done in  march), f/u in early june after DEXA here or later july after labs and dexa  done to disuss plan    Electronically signed by Vanetta Shawl MD on 09/06/2017 at 02:12 PM EDT    Sign off status: Completed        * * *        [facility]    [address]    [place], [place] [address]    Tel: [phone]    Fax: [phone]              * * *          Patient: Ann Blankenship, Ann Blankenship DOB: 1960/12/27 Progress Note: Vanetta Shawl  09/06/2017    ---    Note generated by eClinicalWorks  EMR/PM Software ([email or web address])

## 2020-07-26 NOTE — Progress Notes (Signed)
* * *        **  [name]    --- ---    37 Y old Female, DOB: 26-Aug-1960    [address], [place], [place] [address]    Home: [phone]    Provider: Vanetta Shawl, MD        * * *    Telephone Encounter    ---    Answered by   Vanetta Shawl  Date: 10/17/2017         Time: 09:04 AM    Reason   Lab results    --- ---            Message                      09/2017 outside lab results reviewed, all labs including basic metabolic profile, vitamin D 25 OH, PTH, TSH, celiac disease screen negative.  Iron studies suggestive of iron deficiency anemia with low iron, low percent saturation, and low ferritin.                Action Taken                      [facility] , MD 10/17/2017 9:05:46 AM > letter: Your blood tests showed that you may have iron deficiency anemia.  I would discuss these laboratory results further with your PCP to see whether or not supplementation with oral iron is warranted.  The rest of your blood tests were normal.      Johnson,Chelsea , LPN 2/72/5366 4:40:34 AM > mailed                    * * *                ---          * * *          Patient: Ann Blankenship, Ann Blankenship DOB: November 01, 1960 Provider: Vanetta Shawl, MD  10/17/2017    ---    Note generated by eClinicalWorks EMR/PM Software ([email or web address])

## 2021-08-07 ENCOUNTER — Ambulatory Visit

## 2022-01-15 ENCOUNTER — Ambulatory Visit: Attending: Radiation Oncology

## 2022-03-29 IMAGING — MG MAMMOGRAPHY SCREENING LEFT UNILATERAL 3D TOMO
4 series · 4 of 12 positions shown · non-contrast
Comparison: Delay in the report was to obtain prior exams for comparison. They 
remain unavailable for comparison at time of this dictation.

________________________________________________________________________________________________ 
MAMMOGRAPHY SCREENING LEFT UNILATERAL 3D TOMO, 03/29/2022 [DATE]: 
CLINICAL INDICATION: History of right breast cancer with mastectomy 1619.
TECHNIQUE: Unilateral left breast digital mammography and 3-D Tomosynthesis were 
obtained. In addition, computer-aided detection was utilized. 
BREAST DENSITY: (Level C) The breasts are heterogeneously dense, which may 
obscure small masses.

[L CC]
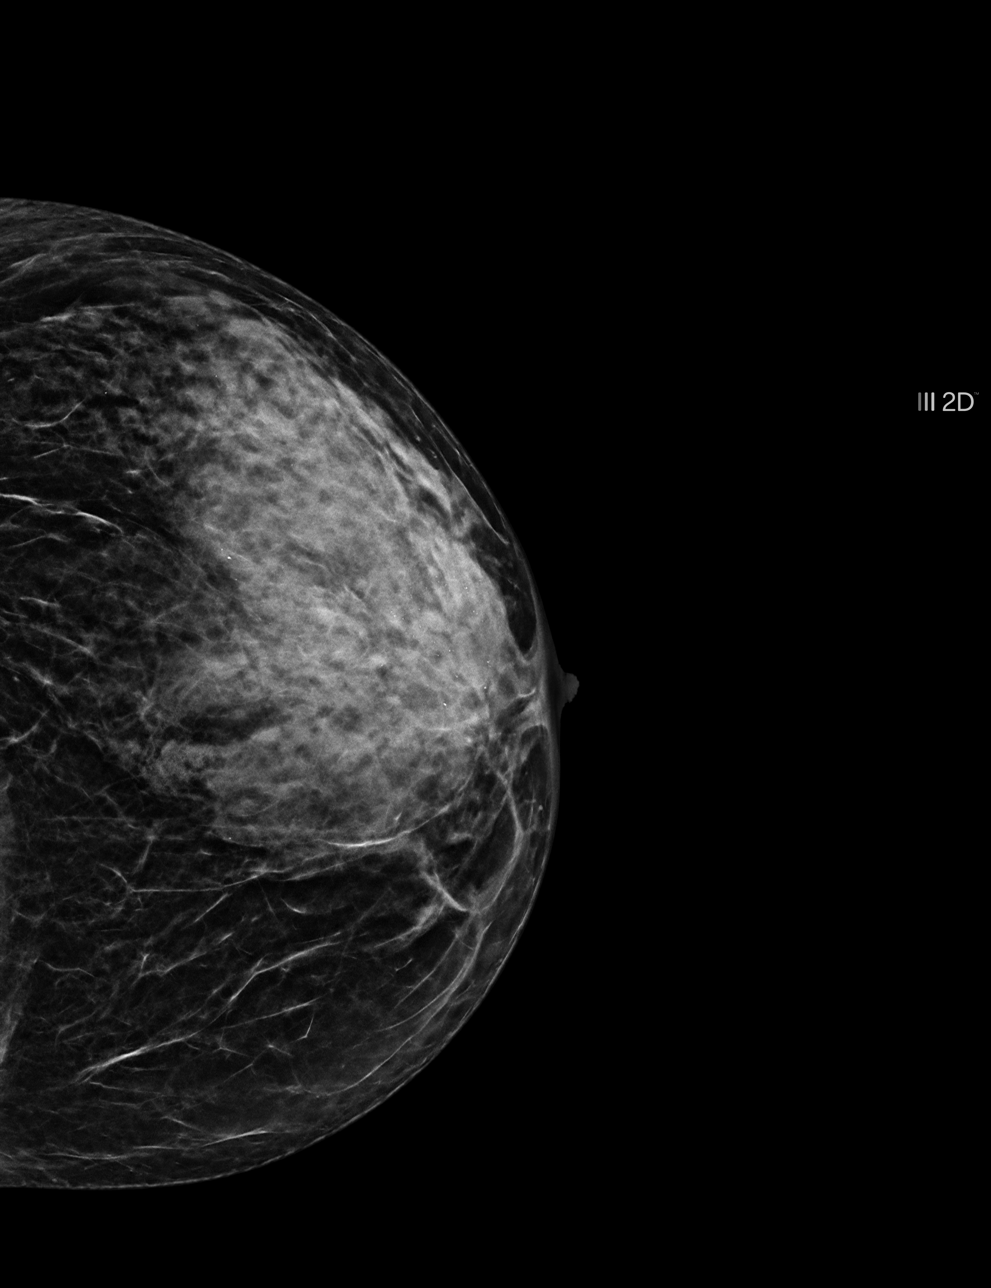

[L MLO]
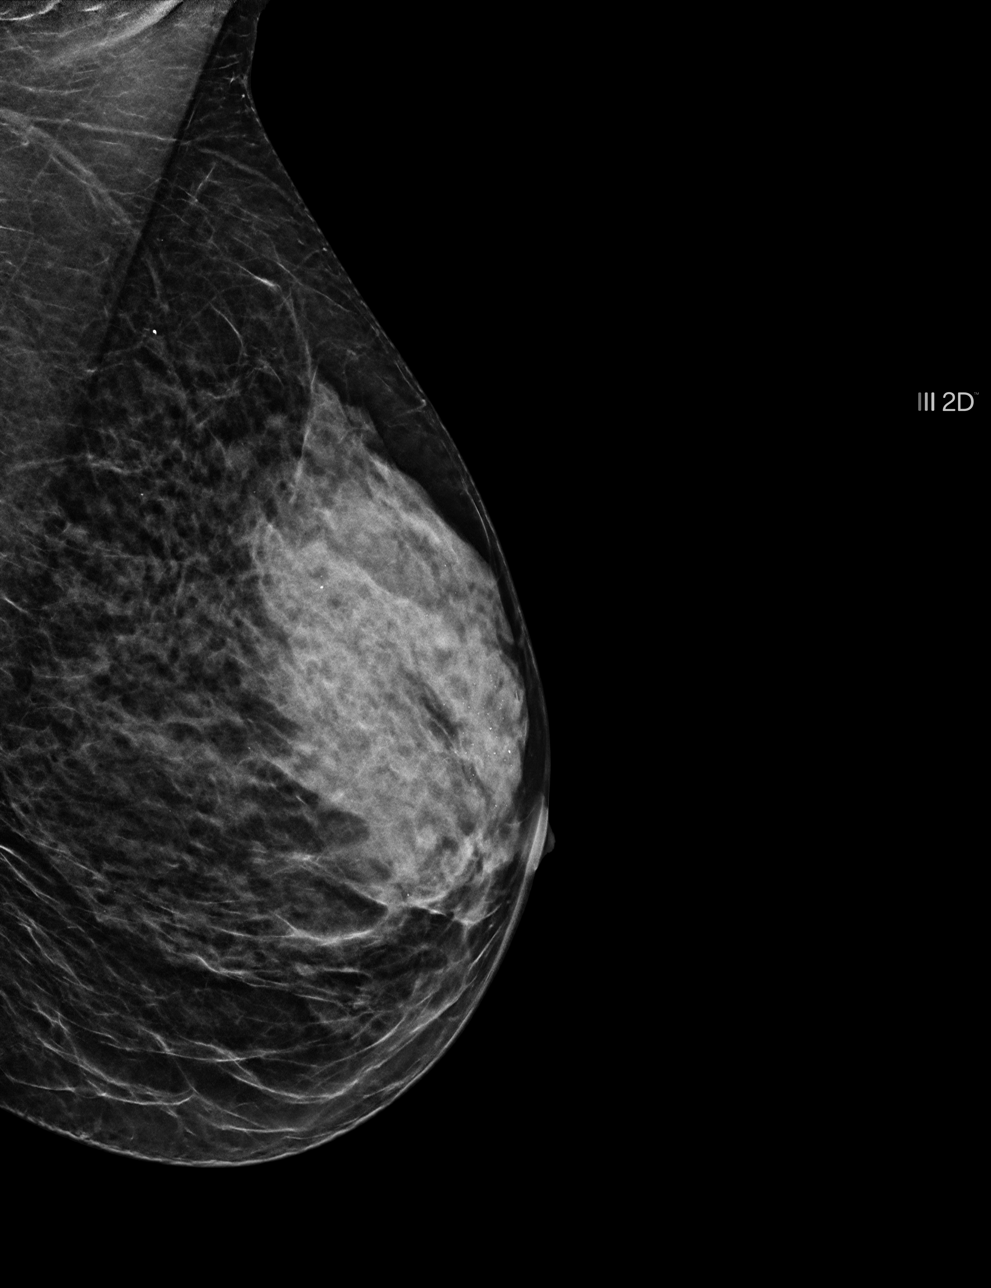

[L MLO tomo · tomo slice 19/37.0]
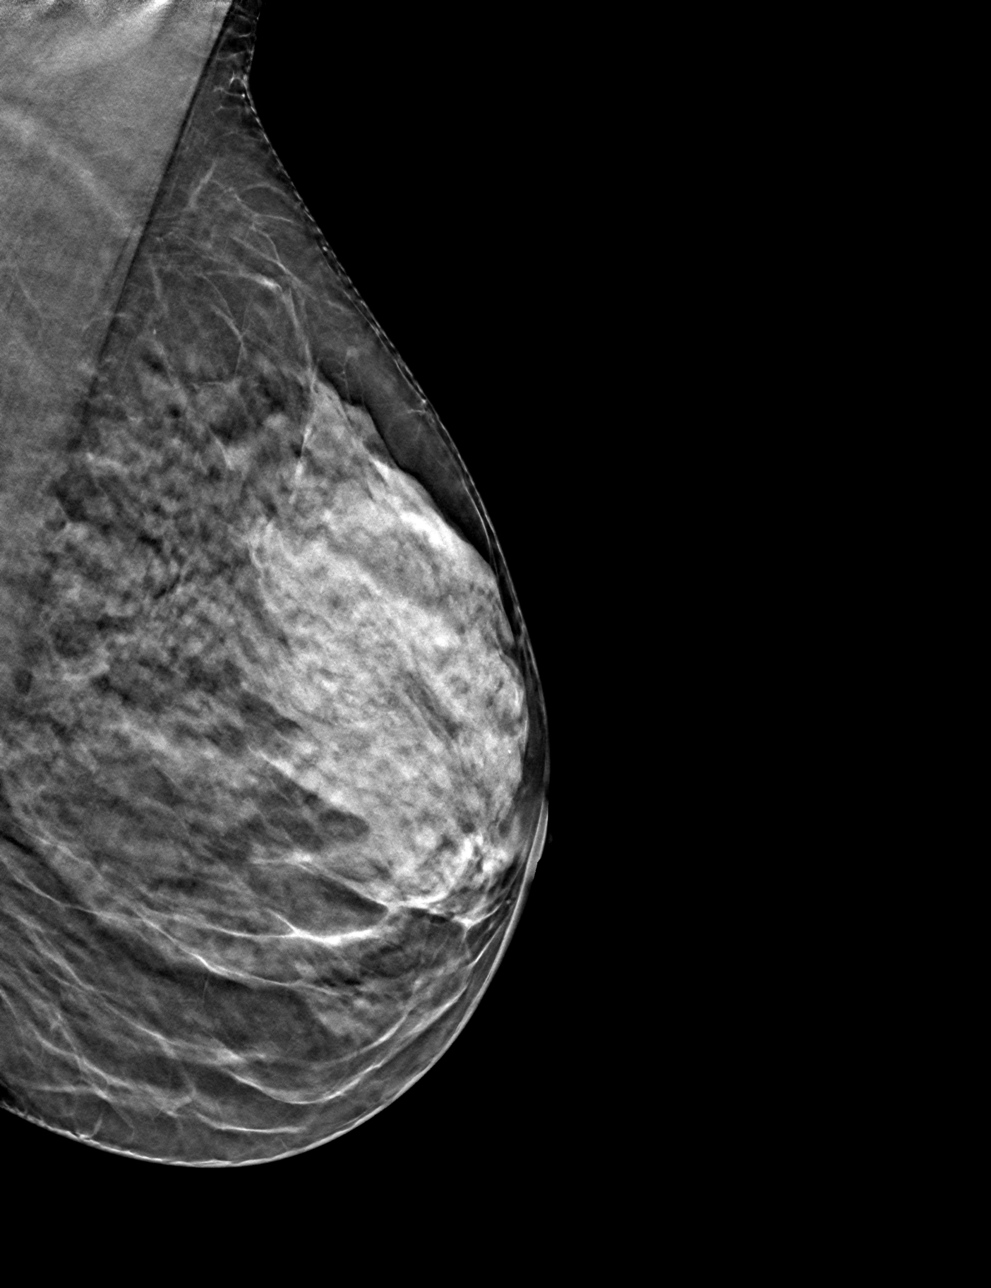

[L CC tomo · tomo slice 19/36.0]
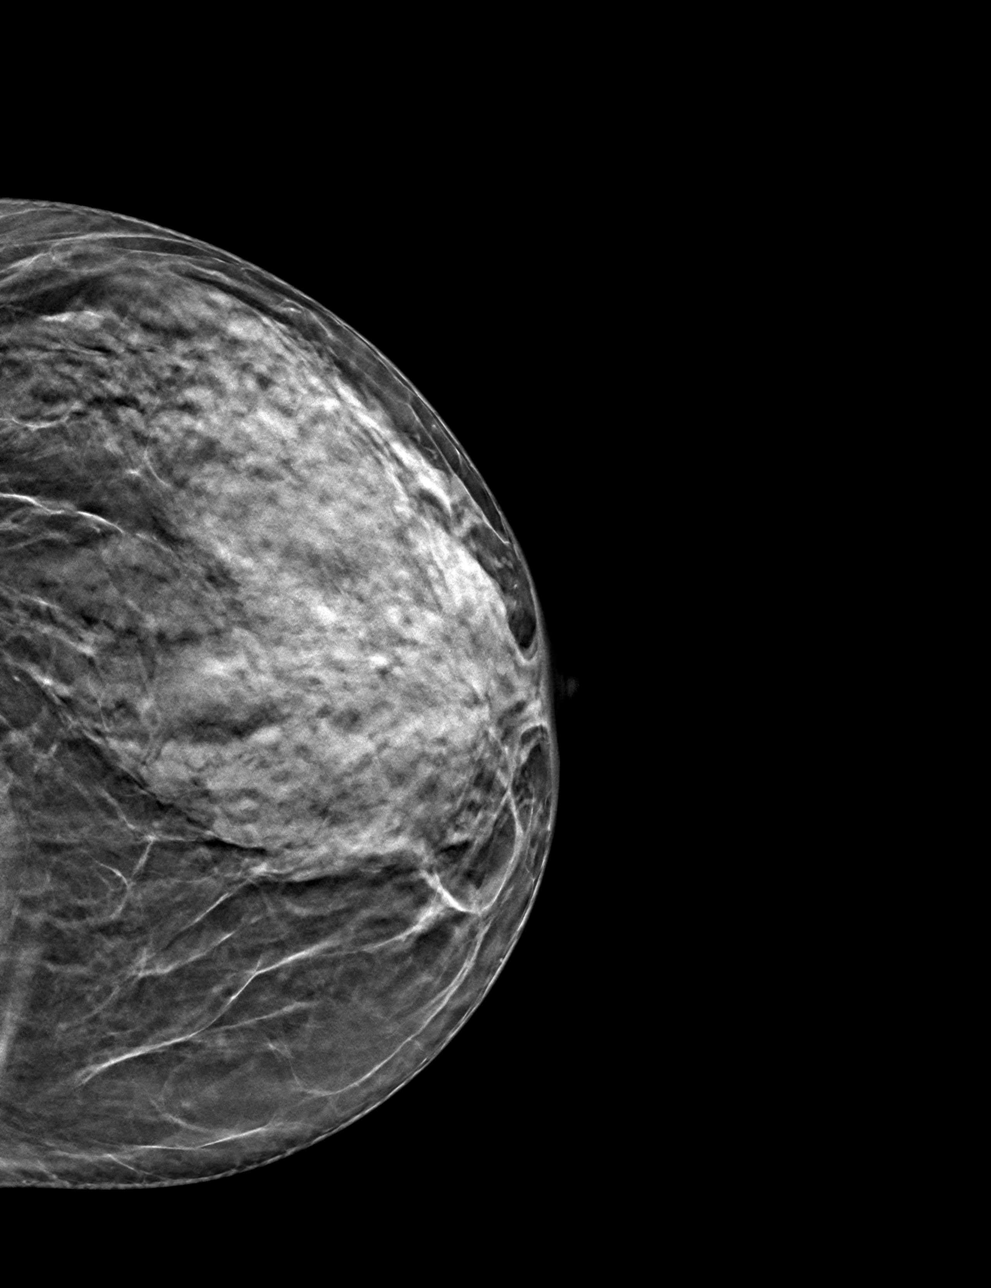

[4 of 12 positions shown; findings below may reference images not displayed]

FINDINGS: No suspicious mass, calcifications, or area of architectural 
distortion in the left breast.
IMPRESSION: No mammographic findings suggestive for malignancy.  
(BI-RADS 2) Benign findings. Routine mammographic follow-up is recommended.

## 2023-03-12 IMAGING — MR MRI BREAST BILATERAL W/WO CONTRAST
5 of 14 series · 15 of 48 positions shown · IV contrast (gadolinium)
Comparison: Mammography from [id number]

________________________________________________________________________________________________ 
MRI BREAST BILATERAL W/WO CONTRAST, 03/12/2023 [DATE]: 
CLINICAL INDICATION: History of right breast carcinoma treated with mastectomy 
and reconstruction in 3829. Chronic pain on the right. Follow-up exam.
TECHNIQUE: Multiple sequences were obtained in various planes with both before 
and after the intravenous administration of gadolinium. In addition to the 
routine images, three-dimensional renderings were performed on an independent 
workstation, time activity curves generated over areas of enhancement and 
computer-aided detection utilized. 4.5 mL of Gadavist were injected 
intravenously. 3 mL of Gadavist discarded. Patient was scanned on a 1.5T magnet.

[Series 201: survey · axial · 10.0mm · 1.76mm/px · z∈[-82,+210]mm · 2 of 35 slices shown]
[im 1/35]
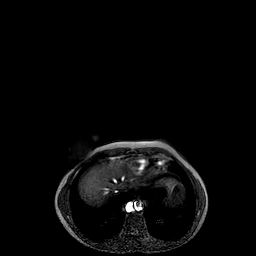
[im 35/35]
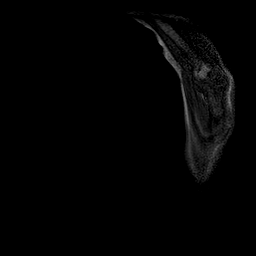

[Series 301: t1w_ffe_3d_cs · axial · 1.6mm · 0.43mm/px · z∈[-105,+94]mm · 5 of 249 slices shown]
[im 1/249]
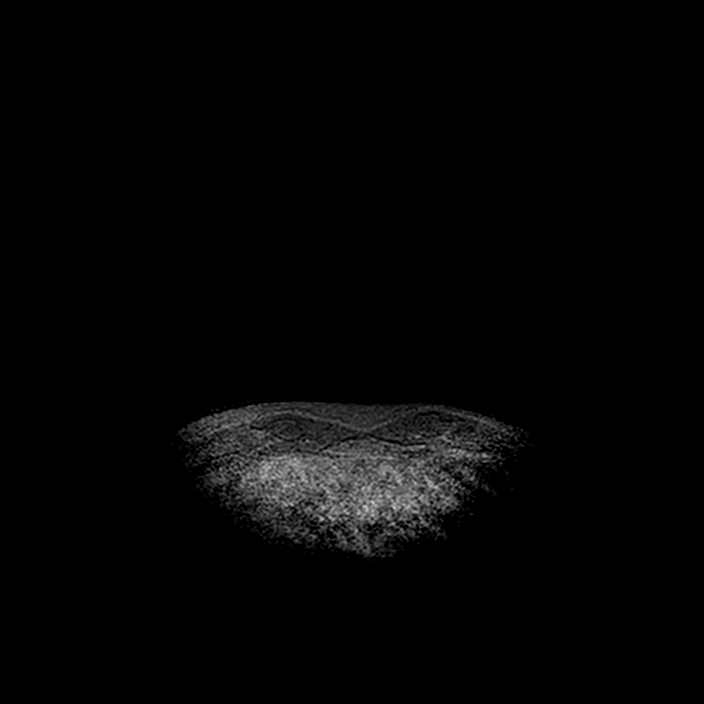
[im 63/249]
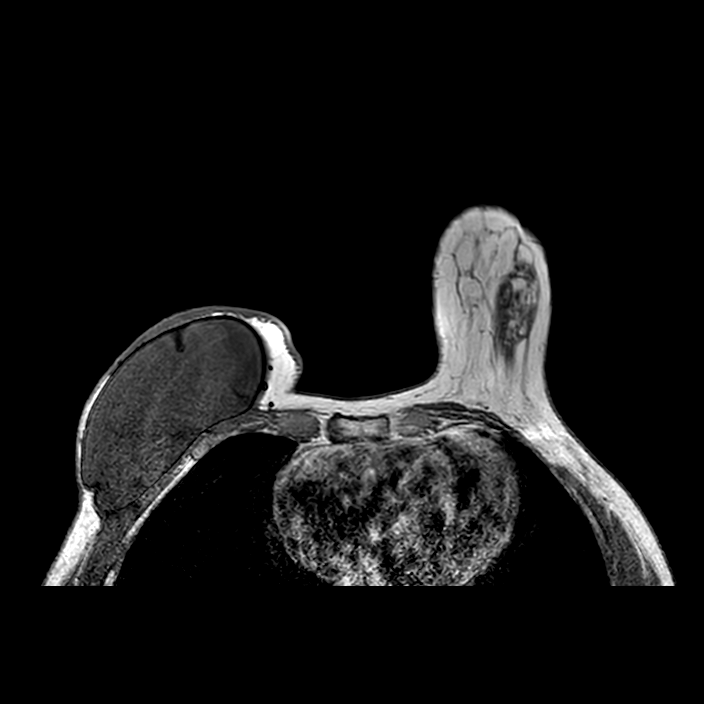
[im 125/249]
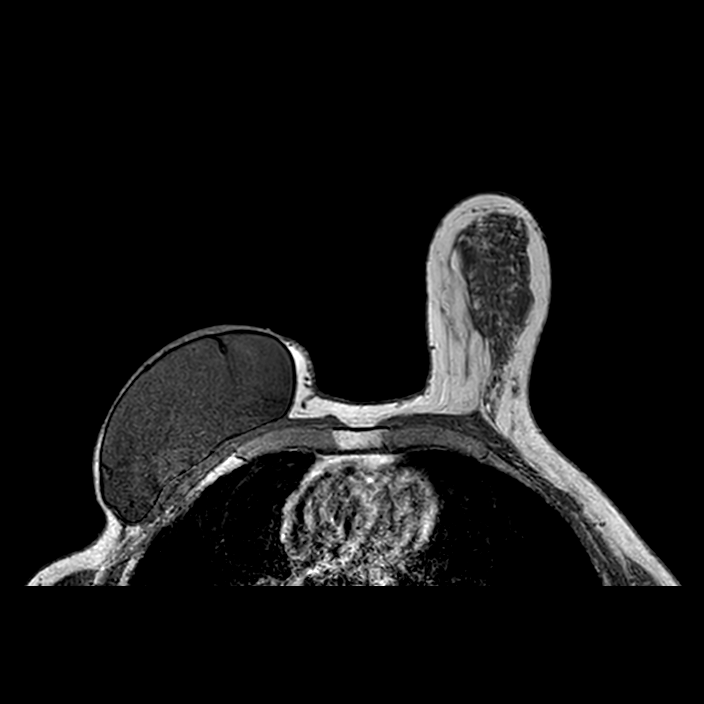
[im 187/249]
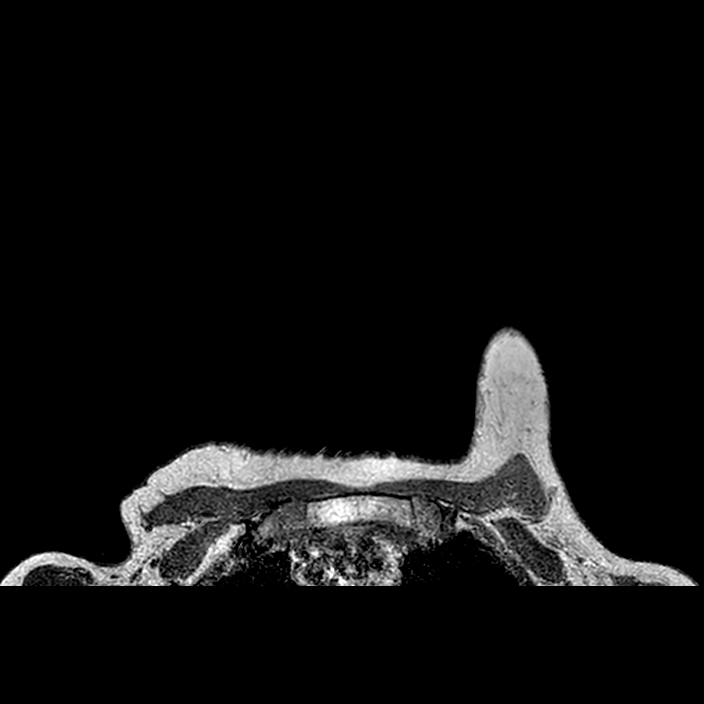
[im 249/249]
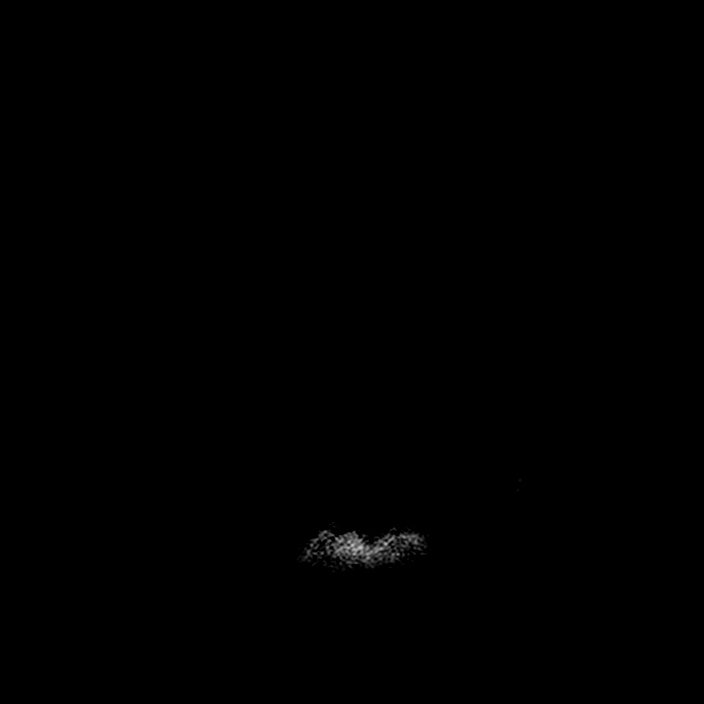

[Series 401: 3d_breastview_t2_spair_16ch · axial · 2.5mm · 0.47mm/px · z∈[-105,+94]mm · 3 of 160 slices shown]
[im 1/160]
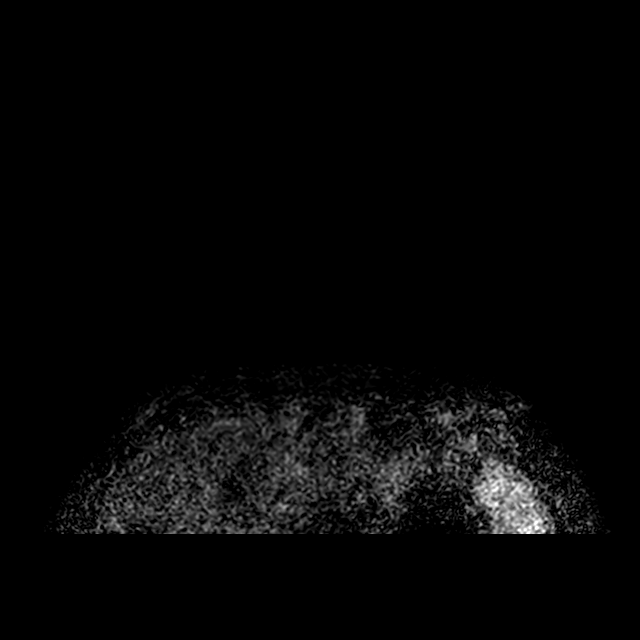
[im 80/160]
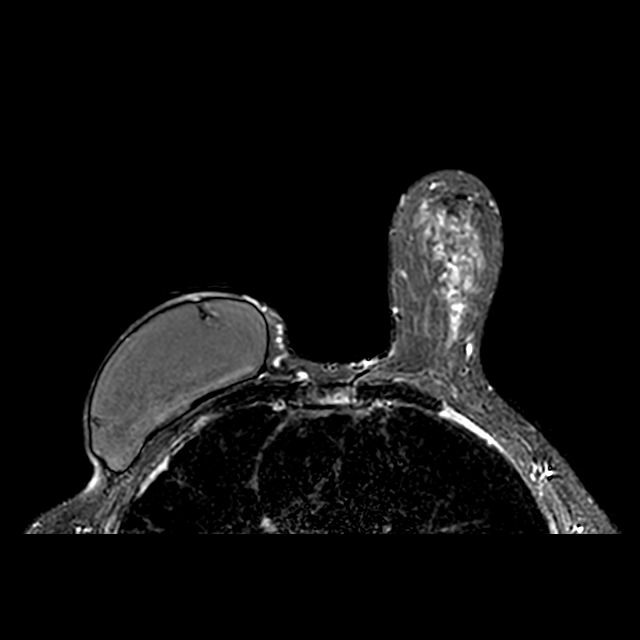
[im 160/160]
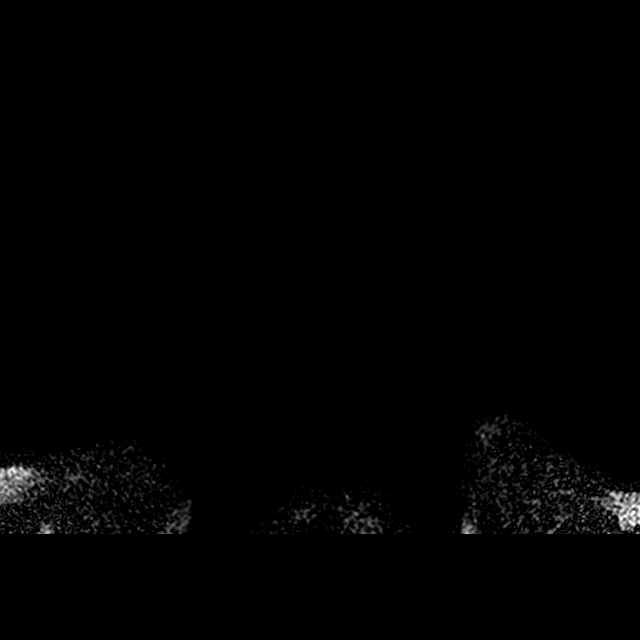

[Series 502: T1 dynamic fat-sat · axial · 2.0mm · 0.59mm/px · z∈[-105,+94]mm · 4 of 200 slices shown]
[im 1/200]
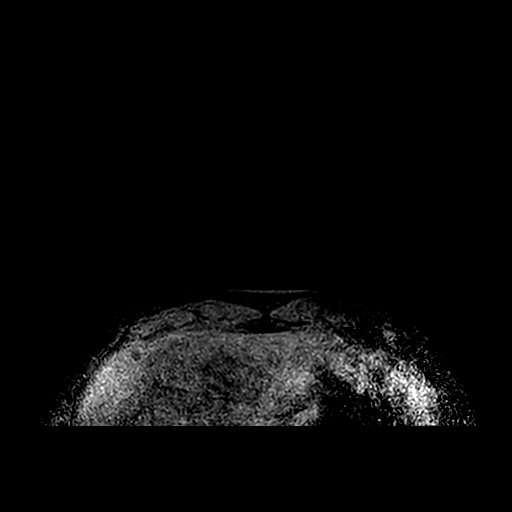
[im 67/200]
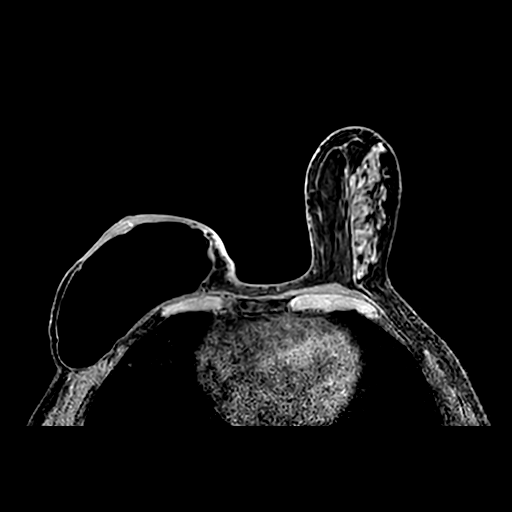
[im 133/200]
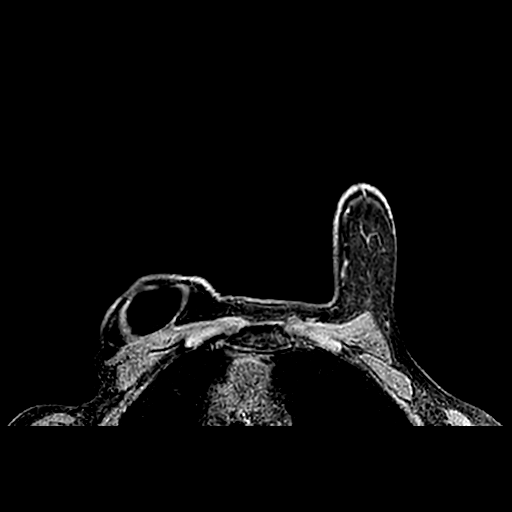
[im 200/200]
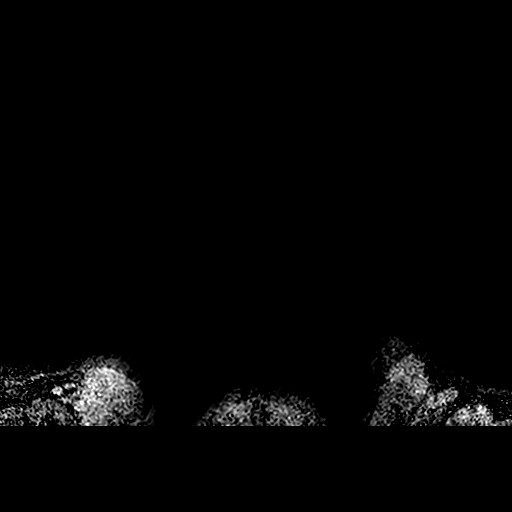

[Series 503: T1 dynamic fat-sat post-contrast · axial · 2.0mm · 0.59mm/px · 1 of 200 slices shown]
[im 1/200]
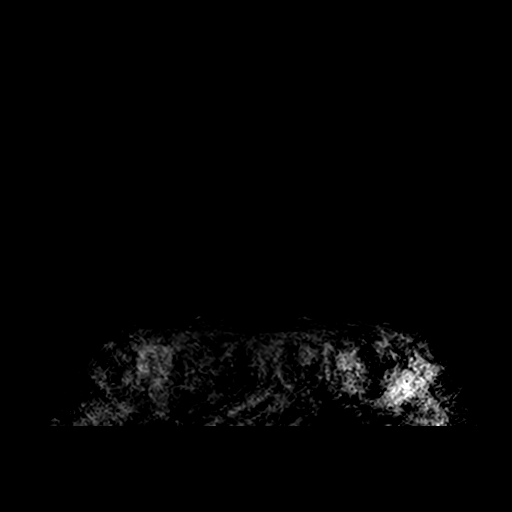

[15 of 48 positions shown; findings below may reference images not displayed]

FINDINGS: Minimal background parenchymal enhancement. 
Right breast:There is been a previous right mastectomy. There is a silicone 
implant on the right without evidence of complication. No suspicious mass or 
adenopathy. 
Left breast: No abnormal areas of enhancement. No areas of enhancement meeting 
threshold criteria on CAD analysis. Small nonspecific left axillary lymph nodes. 
 No internal mammary nodes.
IMPRESSION: Benign findings. 
(BI-RADS 2) Benign findings. Routine mammographic follow-up is recommended.
